# Patient Record
Sex: Male | Born: 1970 | Race: White | Hispanic: No | Marital: Married | Smoking: Current every day smoker
Health system: Southern US, Community
[De-identification: ages and names within clinical notes are randomized; demographics above are authoritative.]

## PROBLEM LIST (undated history)

## (undated) DIAGNOSIS — L0591 Pilonidal cyst without abscess: Secondary | ICD-10-CM

## (undated) HISTORY — PX: NO PAST SURGERIES: SHX2092

---

## 2007-02-16 ENCOUNTER — Observation Stay: Payer: Self-pay | Admitting: Internal Medicine

## 2007-02-16 ENCOUNTER — Other Ambulatory Visit: Payer: Self-pay

## 2007-02-19 ENCOUNTER — Ambulatory Visit: Payer: Self-pay | Admitting: Internal Medicine

## 2008-02-22 ENCOUNTER — Emergency Department: Payer: Self-pay | Admitting: Emergency Medicine

## 2014-01-29 ENCOUNTER — Ambulatory Visit: Payer: Self-pay | Admitting: Psychology

## 2015-05-27 ENCOUNTER — Ambulatory Visit: Payer: Self-pay | Admitting: Psychology

## 2016-04-05 ENCOUNTER — Emergency Department
Admission: EM | Admit: 2016-04-05 | Discharge: 2016-04-05 | Disposition: A | Discharge status: Home / Self Care | Payer: Self-pay | Attending: Emergency Medicine | Admitting: Emergency Medicine

## 2016-04-05 ENCOUNTER — Encounter: Payer: Self-pay | Admitting: Emergency Medicine

## 2016-04-05 ENCOUNTER — Emergency Department: Payer: Self-pay

## 2016-04-05 DIAGNOSIS — S46912A Strain of unspecified muscle, fascia and tendon at shoulder and upper arm level, left arm, initial encounter: Secondary | ICD-10-CM | POA: Insufficient documentation

## 2016-04-05 DIAGNOSIS — F17200 Nicotine dependence, unspecified, uncomplicated: Secondary | ICD-10-CM | POA: Insufficient documentation

## 2016-04-05 DIAGNOSIS — Y939 Activity, unspecified: Secondary | ICD-10-CM | POA: Insufficient documentation

## 2016-04-05 DIAGNOSIS — Y929 Unspecified place or not applicable: Secondary | ICD-10-CM | POA: Insufficient documentation

## 2016-04-05 DIAGNOSIS — Y999 Unspecified external cause status: Secondary | ICD-10-CM | POA: Insufficient documentation

## 2016-04-05 DIAGNOSIS — X501XXA Overexertion from prolonged static or awkward postures, initial encounter: Secondary | ICD-10-CM | POA: Insufficient documentation

## 2016-04-05 MED ORDER — OXYCODONE-ACETAMINOPHEN 7.5-325 MG PO TABS: 1.0000 | ORAL_TABLET | ORAL | 0 refills | Status: DC | PRN

## 2016-04-05 MED ORDER — IBUPROFEN 100 MG/5ML PO SUSP: 800.0000 mg | Freq: Once | ORAL | Status: DC

## 2016-04-05 MED ORDER — OXYCODONE-ACETAMINOPHEN 5-325 MG PO TABS
1.0000 | ORAL_TABLET | Freq: Once | ORAL | Status: AC
  Administered 2016-04-05: 1 via ORAL
  Filled 2016-04-05: qty 1

## 2016-04-05 MED ORDER — CYCLOBENZAPRINE HCL 10 MG PO TABS: 10.0000 mg | ORAL_TABLET | Freq: Three times a day (TID) | ORAL | 0 refills | Status: DC | PRN

## 2016-04-05 MED ORDER — IBUPROFEN 800 MG PO TABS
ORAL_TABLET | ORAL | Status: AC
  Administered 2016-04-05: 800 mg
  Filled 2016-04-05: qty 1

## 2016-04-05 MED ORDER — IBUPROFEN 800 MG PO TABS: 800.0000 mg | ORAL_TABLET | Freq: Three times a day (TID) | ORAL | 0 refills | Status: DC | PRN

## 2016-04-05 MED ORDER — CYCLOBENZAPRINE HCL 10 MG PO TABS
10.0000 mg | ORAL_TABLET | Freq: Once | ORAL | Status: AC
  Administered 2016-04-05: 10 mg via ORAL
  Filled 2016-04-05: qty 1

## 2016-04-05 NOTE — ED Triage Notes (Signed)
Patient c/o left shoulder pain x 1 month. Patient denies injury. Sensory and motor intact.

## 2016-04-05 NOTE — ED Provider Notes (Signed)
[facility] Emergency Department Provider Note   ____________________________________________   First MD Initiated Contact with Patient 04/05/16 1125     (approximate)  I have reviewed the triage vital signs and the nursing notes.   HISTORY  Chief Complaint Shoulder Pain    HPI Travis Miles is a 46 y.o. male patient complaining of left shoulder pain for 1 month. Patient stated complaints started after his large dog pulled him on leash while chasing a cat.Patient only transient relief over-the-counter preparations including pain patches and anti-inflammatory medications. Patient stated the pain starts from the scapular area and radiates down his arm. Patient rates his pain as a 6/10. She describes pain as "achy/sharp".   History reviewed. No pertinent past medical history.  There are no active problems to display for this patient.   History reviewed. No pertinent surgical history.  Prior to Admission medications   Medication Sig Start Date End Date Taking? Authorizing Provider  cyclobenzaprine (FLEXERIL) 10 MG tablet Take 1 tablet (10 mg total) by mouth 3 (three) times daily as needed. 04/05/16   Joni Reining, PA-C  ibuprofen (ADVIL,MOTRIN) 800 MG tablet Take 1 tablet (800 mg total) by mouth every 8 (eight) hours as needed for moderate pain. 04/05/16   Joni Reining, PA-C  oxyCODONE-acetaminophen (PERCOCET) 7.5-325 MG tablet Take 1 tablet by mouth every 4 (four) hours as needed for severe pain. 04/05/16 04/05/17  Joni Reining, PA-C    Allergies Patient has no known allergies.  No family history on file.  Social History Social History  Substance Use Topics  . Smoking status: Current Every Day Smoker    Packs/day: 2.00  . Smokeless tobacco: Never Used  . Alcohol use No    Review of Systems Constitutional: No fever/chills Eyes: No visual changes. ENT: No sore throat. Cardiovascular: Denies chest pain. Respiratory: Denies shortness of  breath. Gastrointestinal: No abdominal pain.  No nausea, no vomiting.  No diarrhea.  No constipation. Genitourinary: Negative for dysuria. Musculoskeletal: Shoulder pain Skin: Negative for rash. Neurological: Negative for headaches, focal weakness or numbness.    ____________________________________________   PHYSICAL EXAM:  VITAL SIGNS: ED Triage Vitals  Enc Vitals Group     BP 04/05/16 1059 (!) 164/84     Pulse Rate 04/05/16 1059 83     Resp --      Temp 04/05/16 1059 97.8 F (36.6 C)     Temp Source 04/05/16 1059 Oral     SpO2 04/05/16 1059 100 %     Weight 04/05/16 1100 235 lb (106.6 kg)     Height 04/05/16 1100 5\' 10"  (1.778 m)     Head Circumference --      Peak Flow --      Pain Score 04/05/16 1101 6     Pain Loc --      Pain Edu? --      Excl. in GC? --     Constitutional: Alert and oriented. Well appearing and in no acute distress. Eyes: Conjunctivae are normal. PERRL. EOMI. Head: Atraumatic. Nose: No congestion/rhinnorhea. Mouth/Throat: Mucous membranes are moist.  Oropharynx non-erythematous. Neck: No stridor.  No cervical spine tenderness to palpation. Hematological/Lymphatic/Immunilogical: No cervical lymphadenopathy. Cardiovascular: Normal rate, regular rhythm. Grossly normal heart sounds.  Good peripheral circulation. Elevated blood pressure Respiratory: Normal respiratory effort.  No retractions. Lungs CTAB. Gastrointestinal: Soft and nontender. No distention. No abdominal bruits. No CVA tenderness. Musculoskeletal: No obvious shoulder deformity. Patient has full nuchal range of motion to bruits  of pain. Patient is moderate guarding palpation of the mid scapular area. Neurologic:  Normal speech and language. No gross focal neurologic deficits are appreciated. No gait instability. Skin:  Skin is warm, dry and intact. No rash noted. Psychiatric: Mood and affect are normal. Speech and behavior are normal.  ____________________________________________     LABS (all labs ordered are listed, but only abnormal results are displayed)  Labs Reviewed - No data to display ____________________________________________  EKG    ___________________________________________  RADIOLOGY   __No acute findings on left shoulder x-ray. __________________________________________   PROCEDURES  Procedure(s) performed: None  Procedures  Critical Care performed: No  ____________________________________________   INITIAL IMPRESSION / ASSESSMENT AND PLAN / ED COURSE  Pertinent labs & imaging results that were available during my care of the patient were reviewed by me and considered in my medical decision making (see chart for details).  Left scapular muscle strain. Patient given discharge care instructions. Patient is a prescription of Percocet, Flexeril, ibuprofen. Patient advised follow "clinic for 3 day blood pressure check.      ____________________________________________   FINAL CLINICAL IMPRESSION(S) / ED DIAGNOSES  Final diagnoses:  Muscle strain of left scapular region, initial encounter      NEW MEDICATIONS STARTED DURING THIS VISIT:  New Prescriptions   CYCLOBENZAPRINE (FLEXERIL) 10 MG TABLET    Take 1 tablet (10 mg total) by mouth 3 (three) times daily as needed.   IBUPROFEN (ADVIL,MOTRIN) 800 MG TABLET    Take 1 tablet (800 mg total) by mouth every 8 (eight) hours as needed for moderate pain.   OXYCODONE-ACETAMINOPHEN (PERCOCET) 7.5-325 MG TABLET    Take 1 tablet by mouth every 4 (four) hours as needed for severe pain.     Note:  This document was prepared using Dragon voice recognition software and may include unintentional dictation errors.    Joni ReiningRonald K Caedan Sumler, PA-C 04/05/16 1149    Merrily BrittleNeil Rifenbark, MD 04/05/16 1239

## 2016-04-05 NOTE — ED Notes (Signed)
See triage note  States he developed pain to left shoulder about 1 week ago   Denies any injury  No deformity noted

## 2016-04-05 NOTE — Discharge Instructions (Signed)
He'll blood pressure elevated 164/84. Due to family history recommend 3 day blood [facility].

## 2016-10-11 ENCOUNTER — Encounter: Payer: Self-pay | Admitting: Emergency Medicine

## 2016-10-11 ENCOUNTER — Emergency Department
Admission: EM | Admit: 2016-10-11 | Discharge: 2016-10-11 | Disposition: A | Discharge status: Home / Self Care | Payer: Self-pay | Attending: Emergency Medicine | Admitting: Emergency Medicine

## 2016-10-11 DIAGNOSIS — F17200 Nicotine dependence, unspecified, uncomplicated: Secondary | ICD-10-CM | POA: Insufficient documentation

## 2016-10-11 DIAGNOSIS — L0501 Pilonidal cyst with abscess: Secondary | ICD-10-CM | POA: Insufficient documentation

## 2016-10-11 HISTORY — DX: Pilonidal cyst without abscess: L05.91

## 2016-10-11 MED ORDER — HYDROCODONE-ACETAMINOPHEN 5-325 MG PO TABS: 1.0000 | ORAL_TABLET | ORAL | 0 refills | Status: DC | PRN

## 2016-10-11 MED ORDER — CLINDAMYCIN HCL 300 MG PO CAPS: 300.0000 mg | ORAL_CAPSULE | Freq: Four times a day (QID) | ORAL | 0 refills | Status: DC

## 2016-10-11 MED ORDER — HYDROCODONE-ACETAMINOPHEN 5-325 MG PO TABS
1.0000 | ORAL_TABLET | Freq: Once | ORAL | Status: AC
  Administered 2016-10-11: 1 via ORAL
  Filled 2016-10-11: qty 1

## 2016-10-11 MED ORDER — CLINDAMYCIN HCL 150 MG PO CAPS
300.0000 mg | ORAL_CAPSULE | Freq: Once | ORAL | Status: AC
  Administered 2016-10-11: 300 mg via ORAL
  Filled 2016-10-11: qty 2

## 2016-10-11 NOTE — ED Provider Notes (Signed)
[facility] Emergency Department Provider Note  ____________________________________________  Time seen: Approximately 10:42 PM  I have reviewed the triage vital signs and the nursing notes.   HISTORY  Chief Complaint Abscess    HPI Travis Miles is a 46 y.o. male presents emergency department for recurrent pilonidal cyst. Patient reports that he has had issues, the last major time being 12 years ago with incision and drainage. Patient has never seen a general surgeon for his pilonidal cyst. Patient reports that he believes that the cyst is moving lower as he is now experiencing pain, swelling, drainage lower in the intergluteal cleft. Patient denies any abdominal pain, blood in his stools. No fevers or chills. Patient reports the area is open, draining.   Past Medical History:  Diagnosis Date  . Pilonidal cyst     There are no active problems to display for this patient.   History reviewed. No pertinent surgical history.  Prior to Admission medications   Medication Sig Start Date End Date Taking? Authorizing Provider  clindamycin (CLEOCIN) 300 MG capsule Take 1 capsule (300 mg total) by mouth 4 (four) times daily. 10/11/16   Naiara Lombardozzi, Delorise Royals, PA-C  cyclobenzaprine (FLEXERIL) 10 MG tablet Take 1 tablet (10 mg total) by mouth 3 (three) times daily as needed. 04/05/16   Joni Reining, PA-C  HYDROcodone-acetaminophen (NORCO/VICODIN) 5-325 MG tablet Take 1 tablet by mouth every 4 (four) hours as needed for moderate pain. 10/11/16   Reice Bienvenue, Delorise Royals, PA-C  ibuprofen (ADVIL,MOTRIN) 800 MG tablet Take 1 tablet (800 mg total) by mouth every 8 (eight) hours as needed for moderate pain. 04/05/16   Joni Reining, PA-C  oxyCODONE-acetaminophen (PERCOCET) 7.5-325 MG tablet Take 1 tablet by mouth every 4 (four) hours as needed for severe pain. 04/05/16 04/05/17  Joni Reining, PA-C    Allergies Patient has no known allergies.  No family history on  file.  Social History Social History  Substance Use Topics  . Smoking status: Current Every Day Smoker    Packs/day: 2.00  . Smokeless tobacco: Never Used  . Alcohol use No     Review of Systems  Constitutional: No fever/chills Cardiovascular: no chest pain. Respiratory: no cough. No SOB. Gastrointestinal: No abdominal pain.  No nausea, no vomiting.  No diarrhea.  No constipation. Musculoskeletal: Negative for musculoskeletal pain. Skin: Negative for rash, abrasions, lacerations, ecchymosis.positive for pilonidal cyst in the intergluteal cleft. Neurological: Negative for headaches, focal weakness or numbness. 10-point ROS otherwise negative.  ____________________________________________   PHYSICAL EXAM:  VITAL SIGNS: ED Triage Vitals  Enc Vitals Group     BP 10/11/16 2159 (!) 180/90     Pulse Rate 10/11/16 2159 91     Resp 10/11/16 2159 18     Temp 10/11/16 2159 98.2 F (36.8 C)     Temp Source 10/11/16 2159 Oral     SpO2 10/11/16 2159 100 %     Weight 10/11/16 2158 255 lb (115.7 kg)     Height 10/11/16 2158  (1.778 m)     Head Circumference --      Peak Flow --      Pain Score 10/11/16 2158 8     Pain Loc --      Pain Edu? --      Excl. in GC? --      Constitutional: Alert and oriented. Well appearing and in no acute distress. Eyes: Conjunctivae are normal. PERRL. EOMI. Head: Atraumatic. ENT:  Ears:       Nose: No congestion/rhinnorhea.      Mouth/Throat: Mucous membranes are moist.  Neck: No stridor.    Cardiovascular: Normal rate, regular rhythm. Normal S1 and S2.  Good peripheral circulation. Respiratory: Normal respiratory effort without tachypnea or retractions. Lungs CTAB. Good air entry to the bases with no decreased or absent breath sounds. Gastrointestinal: Bowel sounds 4 quadrants. Soft and nontender to palpation. No guarding or rigidity. No palpable masses. No distention. Musculoskeletal: Full range of motion to all extremities. No  gross deformities appreciated. Neurologic:  Normal speech and language. No gross focal neurologic deficits are appreciated.  Skin:  Skin is warm, dry and intact. No rash noted.erythematous, edematous skin lesion noted in the intergluteal cleft. Area is open, draining purulent interior. Area is tender to palpation. Palpation expresses minimal amount of pustular material. No significant fluctuance. Approximately 4 cm above current lesion, scarring consistent with previous drainage site from pilonidal cyst. Area appears to have a fistula tract from previous cyst. Psychiatric: Mood and affect are normal. Speech and behavior are normal. Patient exhibits appropriate insight and judgement.   ____________________________________________   LABS (all labs ordered are listed, but only abnormal results are displayed)  Labs Reviewed - No data to display ____________________________________________  EKG   ____________________________________________  RADIOLOGY   No results found.  ____________________________________________    PROCEDURES  Procedure(s) performed:    Procedures    Medications  clindamycin (CLEOCIN) capsule 300 mg (300 mg Oral Given 10/11/16 2301)  HYDROcodone-acetaminophen (NORCO/VICODIN) 5-325 MG per tablet 1 tablet (1 tablet Oral Given 10/11/16 2301)     ____________________________________________   INITIAL IMPRESSION / ASSESSMENT AND PLAN / ED COURSE  Pertinent labs & imaging results that were available during my care of the patient were reviewed by me and considered in my medical decision making (see chart for details).  Review of the [place] CSRS was performed in accordance of the [facility] prior to dispensing any controlled drugs.     Patient's diagnosis is consistent with infected pilonidal cyst.  The patient has tract extending further into the gluteal cleft. Area is open and draining. I offer patient anesthetized patient to extend opening with a scalpel.  Patient declined at this time. This time, no large collection of purulent material in draining wound. No area for packing material. At this time,  no further incisionper patient's wish. I have given patient instructions to return for any increase in size, increasing. Material, increasing pain, rectal pain. Patient placed on clindamycin and limited pain medication. Patient will follow-up with general surgery for removal of pilonidal cyst.. Patient is given ED precautions to return to the ED for any worsening or new symptoms.     ____________________________________________  FINAL CLINICAL IMPRESSION(S) / ED DIAGNOSES  Final diagnoses:  Pilonidal abscess      NEW MEDICATIONS STARTED DURING THIS VISIT:  Discharge Medication List as of 10/11/2016 11:07 PM    START taking these medications   Details  clindamycin (CLEOCIN) 300 MG capsule Take 1 capsule (300 mg total) by mouth 4 (four) times daily., Starting Tue 10/11/2016, Print    HYDROcodone-acetaminophen (NORCO/VICODIN) 5-325 MG tablet Take 1 tablet by mouth every 4 (four) hours as needed for moderate pain., Starting Tue 10/11/2016, Print            This chart was dictated using voice recognition software/Dragon. Despite best efforts to proofread, errors can occur which can change the meaning. Any change was purely unintentional.    Shandria Clinch, Delorise Royals,  PA-C 10/11/16 2356    Phineas Semen, MD 10/12/16 [phone]

## 2016-10-11 NOTE — ED Triage Notes (Signed)
Patient ambulatory to triage with steady gait, without difficulty or distress noted; pt reports pilonidal cyst noted 4 days ago with hx of same

## 2016-10-18 ENCOUNTER — Encounter: Payer: Self-pay | Admitting: General Practice

## 2016-10-18 ENCOUNTER — Telehealth: Payer: Self-pay | Admitting: General Practice

## 2016-10-18 NOTE — Telephone Encounter (Signed)
Unable to leave message for patient for an ED Follow-up (10/11/16): Pilonidal Abscess. Mailed letter to patient to call the office.

## 2016-10-18 NOTE — Telephone Encounter (Signed)
Patients coming in on 10/21/16.

## 2016-10-19 ENCOUNTER — Other Ambulatory Visit: Payer: Self-pay

## 2016-10-19 DIAGNOSIS — L0591 Pilonidal cyst without abscess: Secondary | ICD-10-CM

## 2016-10-21 ENCOUNTER — Encounter: Payer: Self-pay | Admitting: Surgery

## 2016-10-21 ENCOUNTER — Ambulatory Visit (INDEPENDENT_AMBULATORY_CARE_PROVIDER_SITE_OTHER): Payer: Self-pay | Admitting: Surgery

## 2016-10-21 VITALS — BP 133/92 | HR 80 | Temp 98.3°F | Ht 70.0 in | Wt 260.0 lb

## 2016-10-21 DIAGNOSIS — L0591 Pilonidal cyst without abscess: Secondary | ICD-10-CM

## 2016-10-21 NOTE — Progress Notes (Signed)
Surgical Clinic History and Physical  Referring provider:  No referring provider defined for this encounter.  HISTORY OF PRESENT ILLNESS (HPI):  46 y.o. male presents for evaluation of recently infected and draining pilonidal cyst, the infection of which patient reports has resolved with a course of oral antibiotics and the drainage of which has substantially decreased. Patient reports he experienced a similar episode from a more superior located sinus 12 years ago and expresses concern that the disease has been extending lower rather than simply becoming symptomatic / infected at a different level along the same area of disease. However, he also states that he is not interestest in surgery for his pilonidal cyst while his neck is bothering him much more and needs to be addressed first. He also otherwise denies any fever/chills, peri-anal pain, or blood per rectum.  PAST MEDICAL HISTORY (PMH):  Past Medical History:  Diagnosis Date  . Pilonidal cyst      PAST SURGICAL HISTORY [facility]):  Past Surgical History:  Procedure Laterality Date  . NO PAST SURGERIES       MEDICATIONS:  Prior to Admission medications   Not on File     ALLERGIES:  No Known Allergies   SOCIAL HISTORY:  Social History   Social History  . Marital status: Married    Spouse name: N/A  . Number of children: N/A  . Years of education: N/A   Occupational History  . Not on file.   Social History Main Topics  . Smoking status: Current Every Day Smoker    Packs/day: 2.00  . Smokeless tobacco: Never Used  . Alcohol use No  . Drug use: Yes    Types: Marijuana  . Sexual activity: Not on file   Other Topics Concern  . Not on file   Social History Narrative  . No narrative on file    The patient currently resides (home / rehab facility / nursing home): Home The patient normally is (ambulatory / bedbound): Ambulatory  FAMILY HISTORY:  History reviewed. No pertinent family history.  Otherwise  negative/non-contributory.  REVIEW OF SYSTEMS:  Constitutional: denies any other weight loss, fever, chills, or sweats  Eyes: denies any other vision changes, history of eye injury  ENT: denies sore throat, hearing problems  Respiratory: denies shortness of breath, wheezing  Cardiovascular: denies chest pain, palpitations  Gastrointestinal: denies abdominal pain, N/V, or diarrhea Musculoskeletal: denies any other joint pains or cramps  Skin: Denies any other rashes or skin discolorations except as per HPI Neurological: denies any other headache, dizziness, weakness  Psychiatric: Denies any other depression, anxiety   All other review of systems were otherwise negative   VITAL SIGNS:  BP (!) 133/92   Pulse 80   Temp 98.3 F (36.8 C) (Oral)   Ht  (1.778 m)   Wt 260 lb (117.9 kg)   BMI 37.31 kg/m   PHYSICAL EXAM:  Constitutional:  -- Normal overweight body habitus  -- Awake, alert, and oriented x3  Eyes:  -- Pupils equally round and reactive to light  -- No scleral icterus  Ear, nose, throat:  -- No jugular venous distension -- No nasal drainage, bleeding Pulmonary:  -- No crackles  -- Equal breath sounds bilaterally -- Breathing non-labored at rest Cardiovascular:  -- S1, S2 present  -- No pericardial rubs  Gastrointestinal:  -- Abdomen soft, nontender, non-distended, no guarding/rebound  -- Multiple pilonidal cyst-associated sinus pores along patient's sacrococcygeal grove into his gluteal cleft, NT without erythema and minimal clear  non-purulent drainage from lower-most sinus opening -- No abdominal masses appreciated, pulsatile or otherwise  Musculoskeletal and Integumentary:  -- Wounds or skin discoloration: None appreciated except pilonidal cyst as described above (GI) -- Extremities: B/L UE and LE FROM, hands and feet warm, no edema  Neurologic:  -- Motor function: Intact and symmetric -- Sensation: Intact and symmetric  Assessment/Plan:  46 y.o. 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   - hygiene and sitz baths discussed   - smoking cessation strongly encouraged  - risks, benefits, and alternatives to surgical resection were discussed, along with anticipated recovery  - return to clinic as needed or as interested in surgical resection  - instructed to call if any questions or concerns  All of the above recommendations were discussed with the patient and patient's family, and all of patient's and family's questions were answered to their expressed satisfaction.  Thank you for the opportunity to participate in this patient's care.  -- Scherrie Gerlach Earlene Plater, MD, RPVI [place]: [facility] General Surgery - Partnering for exceptional care. Office: [phone]

## 2016-10-21 NOTE — Patient Instructions (Addendum)
Pilonidal Cyst A pilonidal cyst is a fluid-filled sac. It forms beneath the skin near your tailbone, at the top of the crease of your buttocks. A pilonidal cyst that is not large or infected may not cause symptoms or problems. If the cyst becomes irritated or infected, it may fill with pus. This causes pain and swelling (pilonidal abscess). An infected cyst may need to be treated with medicine, drained, or removed. What are the causes? The cause of a pilonidal cyst is not known. One cause may be a hair that grows into your skin (ingrown hair). What increases the risk? Pilonidal cysts are more common in boys and men. Risk factors include:  Having lots of hair near the crease of the buttocks.  Being overweight.  Having a pilonidal dimple.  Wearing tight clothing.  Not bathing or showering frequently.  Sitting for long periods of time.  What are the signs or symptoms? Signs and symptoms of a pilonidal cyst may include:  Redness.  Pain and tenderness.  Warmth.  Swelling.  Pus.  Fever.  How is this diagnosed? Your health care provider may diagnose a pilonidal cyst based on your symptoms and a physical exam. The health care provider may do a blood test to check for infection. If your cyst is draining pus, your health care provider may take a sample of the drainage to be tested at a laboratory. How is this treated? Surgery is the usual treatment for an infected pilonidal cyst. You may also have to take medicines before surgery. The type of surgery you have depends on the size and severity of the infected cyst. The different kinds of surgery include:  Incision and drainage. This is a procedure to open and drain the cyst.  Marsupialization. In this procedure, a large cyst or abscess may be opened and kept open by stitching the edges of the skin to the cyst walls.  Cyst removal. This procedure involves opening the skin and removing all or part of the cyst.  Follow these  instructions at home:  Follow all of your surgeon's instructions carefully if you had surgery.  Take medicines only as directed by your health care provider.  If you were prescribed an antibiotic medicine, finish it all even if you start to feel better.  Keep the area around your pilonidal cyst clean and dry.  Clean the area as directed by your health care provider. Pat the area dry with a clean towel. Do not rub it as this may cause bleeding.  Remove hair from the area around the cyst as directed by your health care provider.  Do not wear tight clothing or sit in one place for long periods of time.  There are many different ways to close and cover an incision, including stitches, skin glue, and adhesive strips. Follow your health care provider's instructions on: ? Incision care. ? Bandage (dressing) changes and removal. ? Incision closure removal. Contact a health care provider if:  You have drainage, redness, swelling, or pain at the site of the cyst.  You have a fever. This information is not intended to replace advice given to you by your health care provider. Make sure you discuss any questions you have with your health care provider. Document Released: 01/08/2000 Document Revised: 06/18/2015 Document Reviewed: 05/30/2013 Elsevier Interactive Patient Education  2018 [employer].  How to Take a [employer] A sitz bath is a warm water bath that is taken while you are sitting down. The water should only come up  to your hips and should cover your buttocks. Your health care provider may recommend a sitz bath to help you:  Clean the lower part of your body, including your genital area.  With itching.  With pain.  With sore muscles or muscles that tighten or spasm.  How to take a sitz bath Take 3-4 sitz baths per day or as told by your health care provider. 1. Partially fill a bathtub with warm water. You will only need the water to be deep enough to cover your hips and  buttocks when you are sitting in it. 2. If your health care provider told you to put medicine in the water, follow the directions exactly. 3. Sit in the water and open the tub drain a little. 4. Turn on the warm water again to keep the tub at the correct level. Keep the water running constantly. 5. Soak in the water for 15-20 minutes or as told by your health care provider. 6. After the sitz bath, pat the affected area dry first. Do not rub it. 7. Be careful when you stand up after the sitz bath because you may feel dizzy.  Contact a health care provider if:  Your symptoms get worse. Do not continue with sitz baths if your symptoms get worse.  You have new symptoms. Do not continue with sitz baths until you talk with your health care provider. This information is not intended to replace advice given to you by your health care provider. Make sure you discuss any questions you have with your health care provider. Document Released: 10/03/2003 Document Revised: 06/10/2015 Document Reviewed: 01/08/2014 Elsevier Interactive Patient Education  [employer].

## 2019-03-05 ENCOUNTER — Emergency Department: Payer: Self-pay

## 2019-03-05 ENCOUNTER — Emergency Department
Admission: EM | Admit: 2019-03-05 | Discharge: 2019-03-06 | Disposition: A | Discharge status: Home / Self Care | Payer: Self-pay | Attending: Emergency Medicine | Admitting: Emergency Medicine

## 2019-03-05 ENCOUNTER — Other Ambulatory Visit: Payer: Self-pay

## 2019-03-05 DIAGNOSIS — R42 Dizziness and giddiness: Secondary | ICD-10-CM | POA: Insufficient documentation

## 2019-03-05 DIAGNOSIS — Z5321 Procedure and treatment not carried out due to patient leaving prior to being seen by health care provider: Secondary | ICD-10-CM | POA: Insufficient documentation
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
## 2019-03-06 NOTE — ED Notes (Signed)
No answer when called several times from lobby 

## 2019-03-07 ENCOUNTER — Telehealth: Payer: Self-pay | Admitting: Emergency Medicine
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
